# Patient Record
Sex: Male | Born: 1990 | Race: White | Hispanic: No | Marital: Single | Smoking: Current every day smoker
Health system: Southern US, Community
[De-identification: ages and names within clinical notes are randomized; demographics above are authoritative.]

## PROBLEM LIST (undated history)

## (undated) HISTORY — PX: TONSILLECTOMY: SUR1361

---

## 2014-06-28 ENCOUNTER — Emergency Department (HOSPITAL_COMMUNITY)
Admission: EM | Admit: 2014-06-28 | Discharge: 2014-06-28 | Disposition: A | Discharge status: Home / Self Care | Payer: Self-pay | Attending: Emergency Medicine | Admitting: Emergency Medicine

## 2014-06-28 ENCOUNTER — Encounter (HOSPITAL_COMMUNITY): Payer: Self-pay | Admitting: Emergency Medicine

## 2014-06-28 ENCOUNTER — Emergency Department (HOSPITAL_COMMUNITY): Payer: Self-pay

## 2014-06-28 DIAGNOSIS — M79651 Pain in right thigh: Secondary | ICD-10-CM

## 2014-06-28 DIAGNOSIS — Y9289 Other specified places as the place of occurrence of the external cause: Secondary | ICD-10-CM | POA: Insufficient documentation

## 2014-06-28 DIAGNOSIS — W01198A Fall on same level from slipping, tripping and stumbling with subsequent striking against other object, initial encounter: Secondary | ICD-10-CM | POA: Insufficient documentation

## 2014-06-28 DIAGNOSIS — S79921A Unspecified injury of right thigh, initial encounter: Secondary | ICD-10-CM | POA: Insufficient documentation

## 2014-06-28 DIAGNOSIS — Z23 Encounter for immunization: Secondary | ICD-10-CM | POA: Insufficient documentation

## 2014-06-28 DIAGNOSIS — Z720 Tobacco use: Secondary | ICD-10-CM | POA: Insufficient documentation

## 2014-06-28 DIAGNOSIS — Y9301 Activity, walking, marching and hiking: Secondary | ICD-10-CM | POA: Insufficient documentation

## 2014-06-28 DIAGNOSIS — S80211A Abrasion, right knee, initial encounter: Secondary | ICD-10-CM | POA: Insufficient documentation

## 2014-06-28 DIAGNOSIS — W19XXXA Unspecified fall, initial encounter: Secondary | ICD-10-CM

## 2014-06-28 MED ORDER — IBUPROFEN 400 MG PO TABS
800.0000 mg | ORAL_TABLET | Freq: Once | ORAL | Status: AC
  Administered 2014-06-28: 800 mg via ORAL
  Filled 2014-06-28: qty 2

## 2014-06-28 MED ORDER — CYCLOBENZAPRINE HCL 10 MG PO TABS: 10.0000 mg | ORAL_TABLET | Freq: Three times a day (TID) | ORAL | Status: AC | PRN

## 2014-06-28 MED ORDER — TETANUS-DIPHTH-ACELL PERTUSSIS 5-2.5-18.5 LF-MCG/0.5 IM SUSP
0.5000 mL | Freq: Once | INTRAMUSCULAR | Status: AC
  Administered 2014-06-28: 0.5 mL via INTRAMUSCULAR
  Filled 2014-06-28: qty 0.5

## 2014-06-28 MED ORDER — IBUPROFEN 800 MG PO TABS: 800.0000 mg | ORAL_TABLET | Freq: Three times a day (TID) | ORAL | Status: AC | PRN

## 2014-06-28 NOTE — ED Provider Notes (Signed)
CSN: [id number]     Arrival date & time 06/28/14  1134 History  This chart was scribed for non-physician practitioner Trixie DredgeEmily Rylyn Zawistowski, PA-C working with Doug SouSam Jacubowitz, MD by Leone PayorSonum Patel, ED Scribe. This patient was seen in room TR10C/TR10C and the patient's care was started at 2:27 PM.    Chief Complaint  Patient presents with  . Leg Pain   The history is provided by the patient. No language interpreter was used.    HPI Comments: Derek Craig is a 23 y.o. male who presents to the Emergency Department complaining of a RLE injury that occurred 9 hours ago. Patient states he was walking on a wooden trailer with missing boards when his left slipped through, striking his right knee on the ground. He complains of constant right thigh pain that is worse with certain movements and ambulation. He rates his pain as a 7/10 currently. He denies weakness, numbness. His last tetanus is out of date.   History reviewed. No pertinent past medical history. Past Surgical History  Procedure Laterality Date  . Tonsillectomy     No family history on file. History  Substance Use Topics  . Smoking status: Current Every Day Smoker -- 1.00 packs/day    Types: Cigarettes  . Smokeless tobacco: Not on file  . Alcohol Use: No    Review of Systems  Musculoskeletal: Positive for arthralgias and myalgias.  Skin: Positive for wound.  Neurological: Negative for weakness and numbness.      Allergies  Review of patient's allergies indicates no known allergies.  Home Medications   Prior to Admission medications   Not on File   BP 139/84  Pulse 77  Temp(Src) 97.9 F (36.6 C) (Oral)  Resp 20  Ht 5\' 11"  (1.803 m)  Wt 135 lb (61.236 kg)  BMI 18.84 kg/m2  SpO2 96% Physical Exam  Nursing note and vitals reviewed. Constitutional: He appears well-developed and well-nourished. No distress.  HENT:  Head: Normocephalic and atraumatic.  Neck: Neck supple.  Pulmonary/Chest: Effort normal.  Musculoskeletal: He  exhibits tenderness.  Tenderness to palpation over the right posteriolateral thigh (biceps femoris) muscle and associated tendon. No bony tenderness. Distal sensation and pulses are intact.  Pt able to ambulate.   Neurological: He is alert.  Skin: He is not diaphoretic.  Small abrasion over the anterior right knee.     ED Course  Procedures (including critical care time)  DIAGNOSTIC STUDIES: Oxygen Saturation is 96% on RA, adequate by my interpretation.    COORDINATION OF CARE: 2:30 PM Discussed treatment plan with pt at bedside and pt agreed to plan.   Labs Review Labs Reviewed - No data to display  Imaging Review Dg Hip Complete Right  06/28/2014   CLINICAL DATA:  Larey SeatFell today, lateral right hip pain and medial right knee pain, initial evaluation  EXAM: RIGHT HIP - COMPLETE 2+ VIEW  COMPARISON:  None.  FINDINGS: There is no evidence of hip fracture or dislocation. There is no evidence of arthropathy or other focal bone abnormality.  IMPRESSION: Negative.   Electronically Signed   By: Esperanza Heiraymond  Rubner M.D.   On: 06/28/2014 14:25   Dg Knee Complete 4 Views Right  06/28/2014   CLINICAL DATA:  Larey SeatFell today, medial right knee pain, initial evaluation  EXAM: RIGHT KNEE - COMPLETE 4+ VIEW  COMPARISON:  None.  FINDINGS: There is no evidence of fracture, dislocation, or joint effusion. There is no evidence of arthropathy or other focal bone abnormality. Incidental note is made of  an approximately 6 mm central tibial plateau bone island. Soft tissues are unremarkable.  IMPRESSION: No acute findings   Electronically Signed   By: Esperanza Heiraymond  Rubner M.D.   On: 06/28/2014 14:26     EKG Interpretation None      MDM   Final diagnoses:  Fall  Right thigh pain    Afebrile, nontoxic patient with right posterior thigh pain after falling through boards on trailer.  Small abrasion over patella but no bony tenderness.  No break in skin or skin changes over area of pain.  Xrays negative.  Compartments  soft.  Neurovascularly intact.   D/C home with flexeril, ibuprofen, PCP follow up.   Discussed result, findings, treatment, and follow up  with patient.  Pt given return precautions.  Pt verbalizes understanding and agrees with plan.       I personally performed the services described in this documentation, which was scribed in my presence. The recorded information has been reviewed and is accurate.   Trixie Dredgemily Alaynna Kerwood, PA-C 06/28/14 1556

## 2014-06-28 NOTE — ED Notes (Signed)
Declined W/C at D/C and was escorted to lobby by RN. 

## 2014-06-28 NOTE — Discharge Instructions (Signed)

## 2014-06-28 NOTE — Discharge Planning (Signed)
[facility]  Spoke to patient about follow up care. Resource guide and my contact information provided for any future questions or concerns.

## 2014-06-28 NOTE — ED Notes (Signed)
Pt was loading a wooden trailer that had loose wooden boards.  His R leg went through the floor and R knee hit the boards.  Pt has been able to ambulate.  No deformities or bruising noted, however, the pt feels increased pain when placing pressure on leg.

## 2014-06-28 NOTE — ED Provider Notes (Signed)
Medical screening examination/treatment/procedure(s) were performed by non-physician practitioner and as supervising physician I was immediately available for consultation/collaboration.   EKG Interpretation None       Nikko Quast, MD 06/28/14 1734 

## 2016-06-22 IMAGING — CR DG HIP COMPLETE 2+V*R*
3 series · 3 of 3 positions shown · non-contrast
Comparison: None.

CLINICAL DATA: Fell today, lateral right hip pain and medial right
knee pain, initial evaluation

EXAM:
RIGHT HIP - COMPLETE 2+ VIEW

[t pelvis ap]
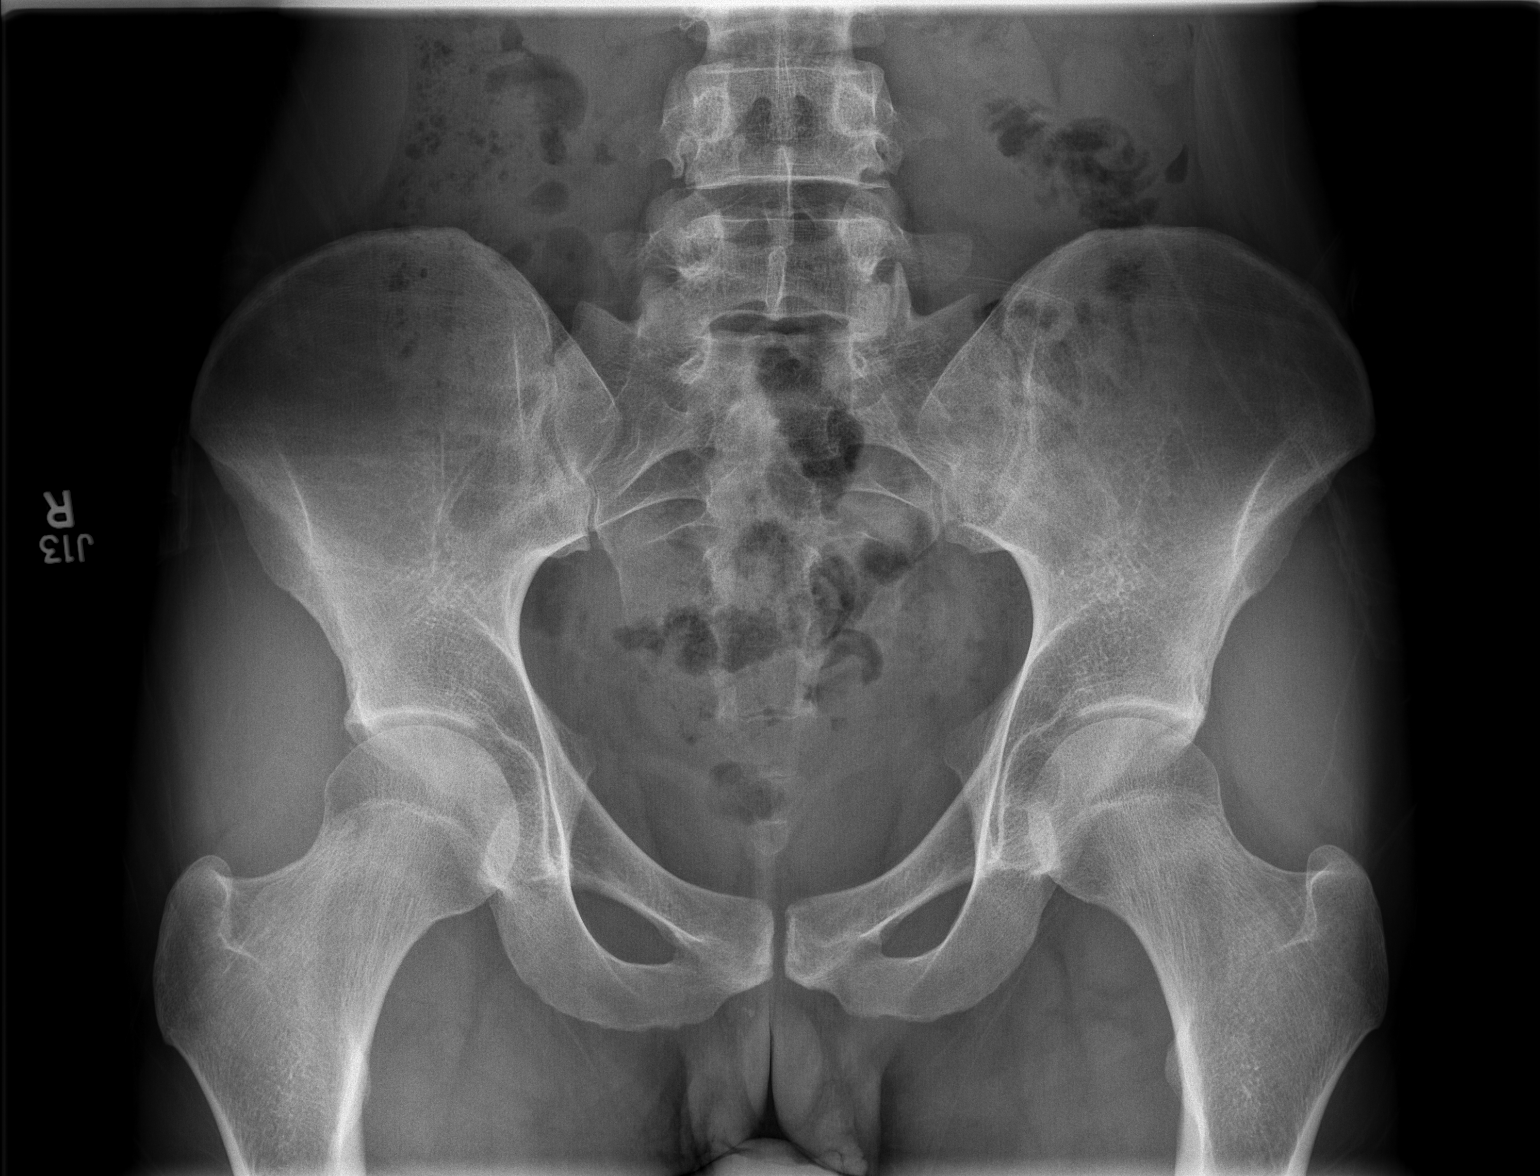

[t hip ap right]
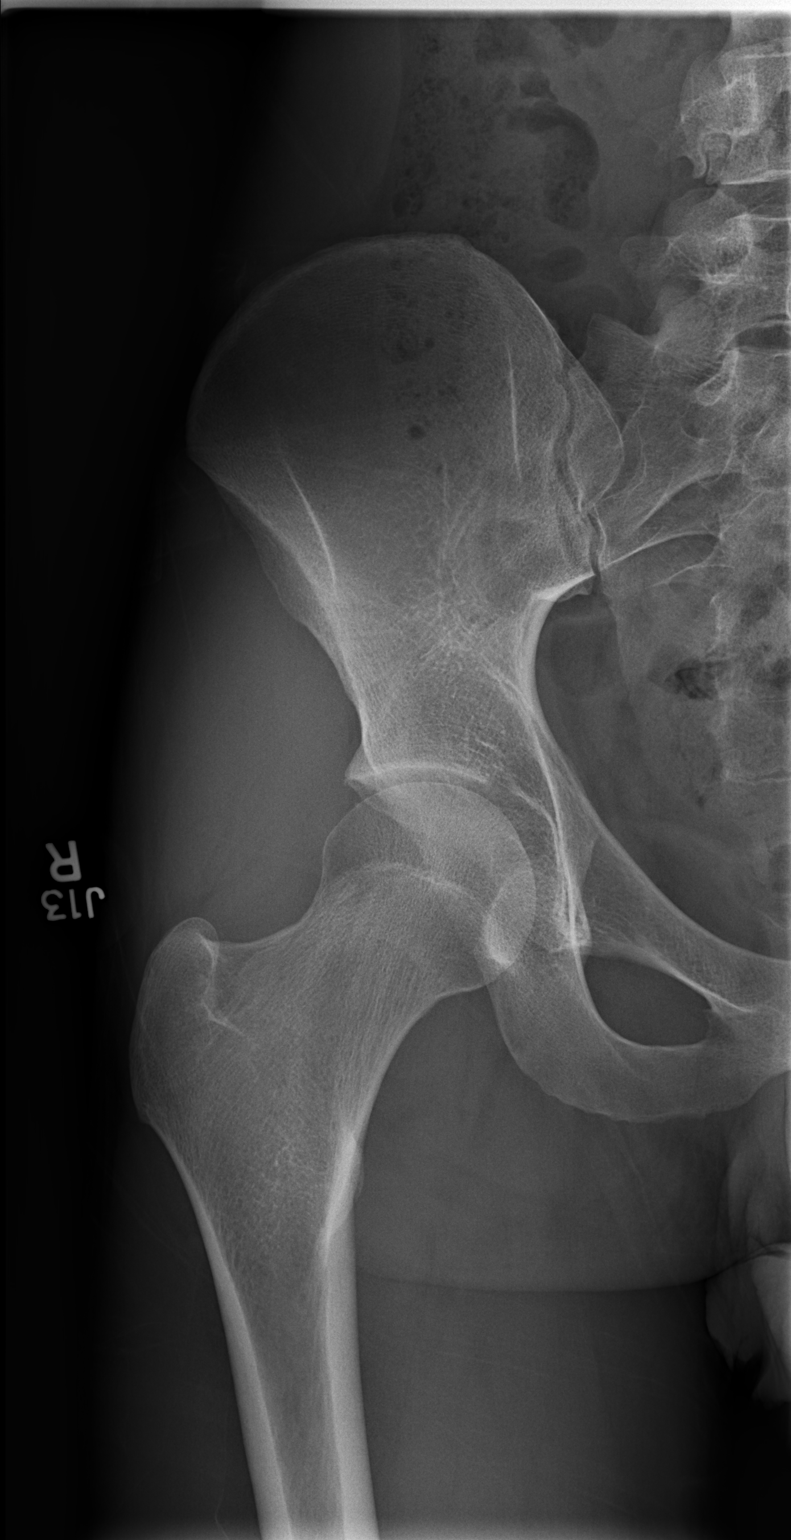

[t hip frog leg right]
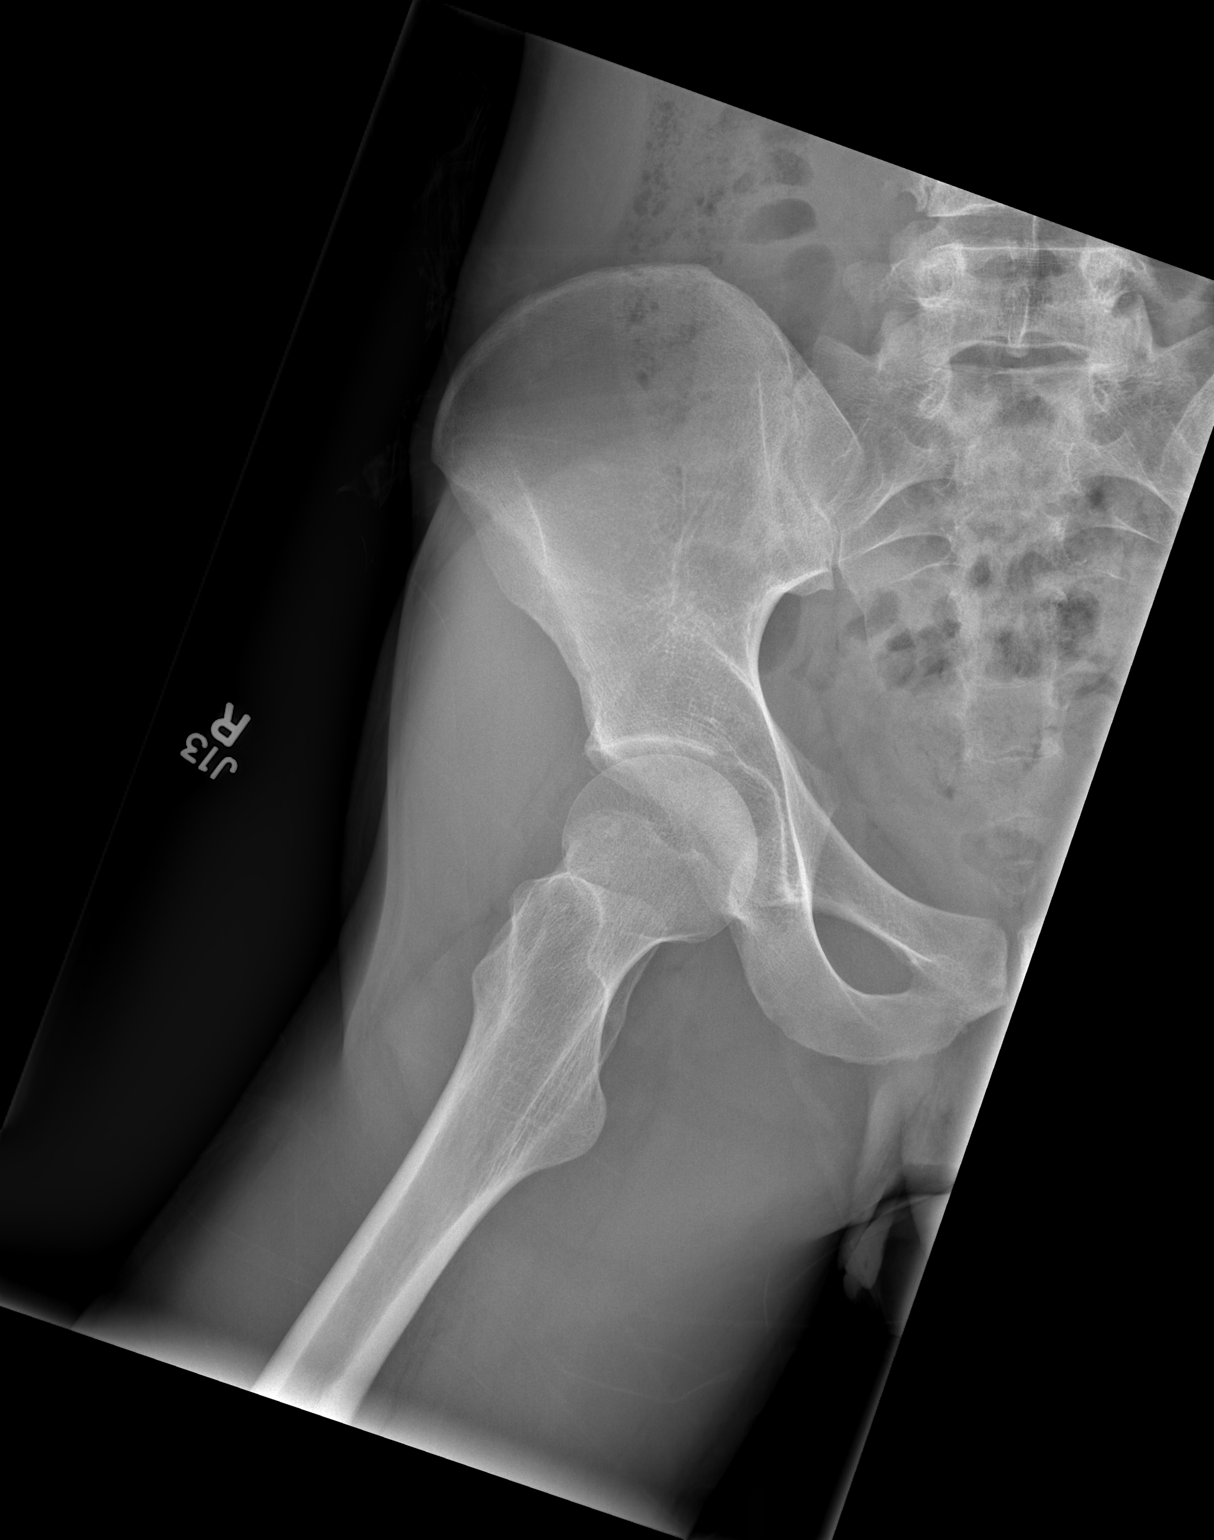

[3 of 3 positions shown; findings below may reference images not displayed]

FINDINGS: There is no evidence of hip fracture or dislocation. There is no
evidence of arthropathy or other focal bone abnormality.
IMPRESSION: Negative.
